# Patient Record
Sex: Female | Born: 1985 | Hispanic: No | Marital: Married | Smoking: Never smoker
Health system: Southern US, Community
[De-identification: ages and names within clinical notes are randomized; demographics above are authoritative.]

## PROBLEM LIST (undated history)

## (undated) DIAGNOSIS — E282 Polycystic ovarian syndrome: Secondary | ICD-10-CM

## (undated) HISTORY — PX: BREAST REDUCTION SURGERY: SHX8

## (undated) HISTORY — PX: DILATION AND CURETTAGE OF UTERUS: SHX78

---

## 2020-01-13 ENCOUNTER — Emergency Department (HOSPITAL_COMMUNITY): Payer: Managed Care, Other (non HMO)

## 2020-01-13 ENCOUNTER — Other Ambulatory Visit: Payer: Self-pay

## 2020-01-13 ENCOUNTER — Encounter (HOSPITAL_COMMUNITY): Payer: Self-pay

## 2020-01-13 ENCOUNTER — Emergency Department (HOSPITAL_COMMUNITY)
Admission: EM | Admit: 2020-01-13 | Discharge: 2020-01-14 | Disposition: A | Discharge status: Home / Self Care | Payer: Managed Care, Other (non HMO) | Attending: Emergency Medicine | Admitting: Emergency Medicine

## 2020-01-13 DIAGNOSIS — R0789 Other chest pain: Secondary | ICD-10-CM | POA: Diagnosis not present

## 2020-01-13 DIAGNOSIS — Y9389 Activity, other specified: Secondary | ICD-10-CM | POA: Insufficient documentation

## 2020-01-13 DIAGNOSIS — Y92410 Unspecified street and highway as the place of occurrence of the external cause: Secondary | ICD-10-CM | POA: Diagnosis not present

## 2020-01-13 DIAGNOSIS — R112 Nausea with vomiting, unspecified: Secondary | ICD-10-CM | POA: Insufficient documentation

## 2020-01-13 DIAGNOSIS — S3991XA Unspecified injury of abdomen, initial encounter: Secondary | ICD-10-CM | POA: Insufficient documentation

## 2020-01-13 DIAGNOSIS — R1011 Right upper quadrant pain: Secondary | ICD-10-CM | POA: Insufficient documentation

## 2020-01-13 DIAGNOSIS — R519 Headache, unspecified: Secondary | ICD-10-CM | POA: Diagnosis not present

## 2020-01-13 DIAGNOSIS — Y999 Unspecified external cause status: Secondary | ICD-10-CM | POA: Diagnosis not present

## 2020-01-13 DIAGNOSIS — S0990XA Unspecified injury of head, initial encounter: Secondary | ICD-10-CM | POA: Insufficient documentation

## 2020-01-13 DIAGNOSIS — S62347A Nondisplaced fracture of base of fifth metacarpal bone. left hand, initial encounter for closed fracture: Secondary | ICD-10-CM | POA: Diagnosis not present

## 2020-01-13 DIAGNOSIS — S6992XA Unspecified injury of left wrist, hand and finger(s), initial encounter: Secondary | ICD-10-CM | POA: Diagnosis present

## 2020-01-13 DIAGNOSIS — S299XXA Unspecified injury of thorax, initial encounter: Secondary | ICD-10-CM | POA: Insufficient documentation

## 2020-01-13 HISTORY — DX: Polycystic ovarian syndrome: E28.2

## 2020-01-13 LAB — I-STAT CHEM 8, ED
BUN: 6 mg/dL (ref 6–20)
Calcium, Ion: 1.13 mmol/L — ABNORMAL LOW (ref 1.15–1.40)
Chloride: 104 mmol/L (ref 98–111)
Creatinine, Ser: 0.6 mg/dL (ref 0.44–1.00)
Glucose, Bld: 96 mg/dL (ref 70–99)
HCT: 37 % (ref 36.0–46.0)
Hemoglobin: 12.6 g/dL (ref 12.0–15.0)
Potassium: 4.1 mmol/L (ref 3.5–5.1)
Sodium: 138 mmol/L (ref 135–145)
TCO2: 22 mmol/L (ref 22–32)

## 2020-01-13 LAB — I-STAT BETA HCG BLOOD, ED (MC, WL, AP ONLY): I-stat hCG, quantitative: <5 m[IU]/mL (ref <5)

## 2020-01-13 MED ORDER — HYDROCODONE-ACETAMINOPHEN 5-325 MG PO TABS: 1.0000 | ORAL_TABLET | Freq: Four times a day (QID) | ORAL | 0 refills | Status: AC | PRN

## 2020-01-13 MED ORDER — IBUPROFEN 600 MG PO TABS
600.0000 mg | ORAL_TABLET | Freq: Four times a day (QID) | ORAL | 0 refills | Status: AC | PRN
Start: 2020-01-13 — End: ?

## 2020-01-13 MED ORDER — IOHEXOL 300 MG/ML  SOLN
100.0000 mL | Freq: Once | INTRAMUSCULAR | Status: AC | PRN
  Administered 2020-01-13: 100 mL via INTRAVENOUS

## 2020-01-13 MED ORDER — MORPHINE SULFATE (PF) 4 MG/ML IV SOLN
4.0000 mg | Freq: Once | INTRAVENOUS | Status: AC
  Administered 2020-01-13: 4 mg via INTRAVENOUS
  Filled 2020-01-13: qty 1

## 2020-01-13 MED ORDER — CYCLOBENZAPRINE HCL 10 MG PO TABS: 10.0000 mg | ORAL_TABLET | Freq: Two times a day (BID) | ORAL | 0 refills | Status: AC | PRN
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
## 2020-01-13 NOTE — Discharge Instructions (Addendum)
Alternate 600 mg of ibuprofen and [phone] mg of Tylenol every 3-4 hours as needed for pain.  Take ibuprofen with food to avoid upset stomach issues.  Stop taking ibuprofen if you develop any upset stomach issues including vomiting blood, abdominal pain, or blood in stools.  Do not exceed 4000 mg of Tylenol daily.   You can take hydrocodone as needed for severe pain but do not drive, drink alcohol, or operate heavy machinery while taking this medicine as it can cause drowsiness.  Be aware this medicine also contains Tylenol and do not exceed more than 4000 mg of Tylenol daily.    You may take Flexeril up to twice daily as needed for muscle spasms. This medication may make you drowsy, so I typically only recommended only taking at night. You may also cut these tablets in half if they are too strong.   Apply ice or heat, whichever feels best, to areas of soreness 20 minutes at a time 3-4 times daily.  Do some gentle stretching throughout the day, especially during or after hot showers or baths. Take short frequent walks and avoid prolonged periods of sitting or laying.  Wear the splint at all times and keep it clean and dry.  You can cover it with a bag and tape it at the top to avoid getting it wet when showering.  Follow-up with Dr. Arita Miss the hand doctor for reevaluation of your hand fracture.  Expect to be sore for the next few day and follow up with primary care physician for recheck of ongoing symptoms (especially if symptoms persist longer than 1 week) but return to ER for emergent changing or worsening of symptoms such as loss of pulses or severe swelling, severe headache that gets worse, altered mental status/behaving unusually, persistent vomiting, excessive drowsiness, numbness to the arms or legs, unsteady gait, or slurred speech.
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
## 2020-01-14 MED ORDER — ONDANSETRON 4 MG PO TBDP
4.0000 mg | ORAL_TABLET | Freq: Once | ORAL | Status: AC
  Administered 2020-01-14: 4 mg via ORAL
  Filled 2020-01-14: qty 1

## 2020-01-14 NOTE — ED Notes (Signed)
Patient became nauseated and started vomiting right before being discharged

## 2020-01-19 ENCOUNTER — Other Ambulatory Visit: Payer: Self-pay

## 2020-01-19 ENCOUNTER — Ambulatory Visit (HOSPITAL_BASED_OUTPATIENT_CLINIC_OR_DEPARTMENT_OTHER)
Admission: RE | Admit: 2020-01-19 | Discharge: 2020-01-19 | Disposition: A | Discharge status: Home / Self Care | Payer: Managed Care, Other (non HMO) | Source: Ambulatory Visit | Attending: Plastic Surgery | Admitting: Plastic Surgery

## 2020-01-19 ENCOUNTER — Encounter: Payer: Self-pay | Admitting: Plastic Surgery

## 2020-01-19 ENCOUNTER — Ambulatory Visit (INDEPENDENT_AMBULATORY_CARE_PROVIDER_SITE_OTHER): Payer: Managed Care, Other (non HMO) | Admitting: Plastic Surgery

## 2020-01-19 VITALS — BP 132/81 | HR 83 | Temp 98.2°F | Ht 63.0 in | Wt 263.2 lb

## 2020-01-19 DIAGNOSIS — S62305A Unspecified fracture of fourth metacarpal bone, left hand, initial encounter for closed fracture: Secondary | ICD-10-CM | POA: Diagnosis present
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
## 2020-01-29 ENCOUNTER — Encounter: Payer: Self-pay | Admitting: Plastic Surgery

## 2020-01-30 ENCOUNTER — Telehealth: Payer: Self-pay | Admitting: *Deleted

## 2020-01-30 NOTE — Telephone Encounter (Signed)
Received Plan of Care on (01/29/20) via of fax from [facility].  Requesting signature.  Given to provider to complete.   Plan of Care signed and faxed.  Confirmation received.  Copy scanned into the chart.//AB/CMA

## 2020-02-11 ENCOUNTER — Ambulatory Visit: Payer: Managed Care, Other (non HMO) | Admitting: Plastic Surgery

## 2020-02-11 ENCOUNTER — Encounter: Payer: Self-pay | Admitting: Plastic Surgery

## 2020-02-11 ENCOUNTER — Ambulatory Visit (INDEPENDENT_AMBULATORY_CARE_PROVIDER_SITE_OTHER): Payer: Managed Care, Other (non HMO) | Admitting: Plastic Surgery

## 2020-02-11 ENCOUNTER — Other Ambulatory Visit: Payer: Self-pay

## 2020-02-11 VITALS — BP 125/89 | HR 105 | Temp 98.4°F

## 2020-02-11 DIAGNOSIS — S62305A Unspecified fracture of fourth metacarpal bone, left hand, initial encounter for closed fracture: Secondary | ICD-10-CM

## 2020-02-11 NOTE — Progress Notes (Signed)
   Referring Provider Medicine, [facility] [address] [place],  [place] [address]   CC:  Chief Complaint  Patient presents with  . Follow-up      Katie Velasquez is an 34 y.o. female.  HPI: Patient presents 4 weeks out from an injury to her left hand.  She had a nondisplaced fracture at the base of the fifth metacarpal.  We treated her nonoperatively with the splint and she is here to discuss moving forward with therapy.  Review of Systems General: Denies fevers or chills  Physical Exam Vitals with BMI 02/11/2020 01/19/2020 01/14/2020  Height - 5\' 3"  -  Weight - 263 lbs 3 oz -  BMI - 46.64 -  Systolic 125 132  Diastolic 89 81 79  Pulse 105 83 88    General:  No acute distress,  Alert and oriented, Non-Toxic, Normal speech and affect Left hand: Fingers well-perfused with normal cap refill palp radial pulse.  Sensation is intact throughout.  She has limited range of motion motion of the small and ring fingers due to stiffness from the splint.  She has minimal tenderness over the area of her fracture.  There is a small amount of swelling there but this may be due to the way the soft tissue was conforming to the splint.  All follow-up x-rays have shown no displacement of the fracture.  Assessment/Plan Patient presents 1 month out from a nondisplaced fracture of the metacarpal.  I suspect this is healed well at this point.  She is ready to increase her range of motion.  We will communicate with the hand therapist to move her along in their protocol.  All her questions were answered.  I will plan to see her again in 6 to 8 weeks but she can always cancel that appointment if she feels like she is doing well.  500 02/11/2020, 3:59 PM

## 2020-02-13 ENCOUNTER — Telehealth: Payer: Self-pay | Admitting: *Deleted

## 2020-02-13 NOTE — Telephone Encounter (Signed)
Received Plan of Care via of fax from [facility] on (02/12/20).  Requesting signature and return.  Plan of Care signed and faxed.  Confirmation received.  Copy scanned into the chart.//AB/CMA

## 2020-02-24 MED ORDER — PROPOFOL 10 MG/ML IV BOLUS
INTRAVENOUS | Status: AC
  Filled 2020-02-24: qty 20

## 2020-02-24 MED ORDER — FENTANYL CITRATE (PF) 100 MCG/2ML IJ SOLN
INTRAMUSCULAR | Status: AC
  Filled 2020-02-24: qty 2

## 2020-02-24 MED ORDER — KETOROLAC TROMETHAMINE 30 MG/ML IJ SOLN
INTRAMUSCULAR | Status: AC
  Filled 2020-02-24: qty 1

## 2021-12-03 IMAGING — DX DG HAND COMPLETE 3+V*L*
3 series · 4 of 4 positions shown · non-contrast
Comparison: 01/13/2020.

CLINICAL DATA: Follow-up fracture.

EXAM:
LEFT HAND - COMPLETE 3+ VIEW

[hand pa]
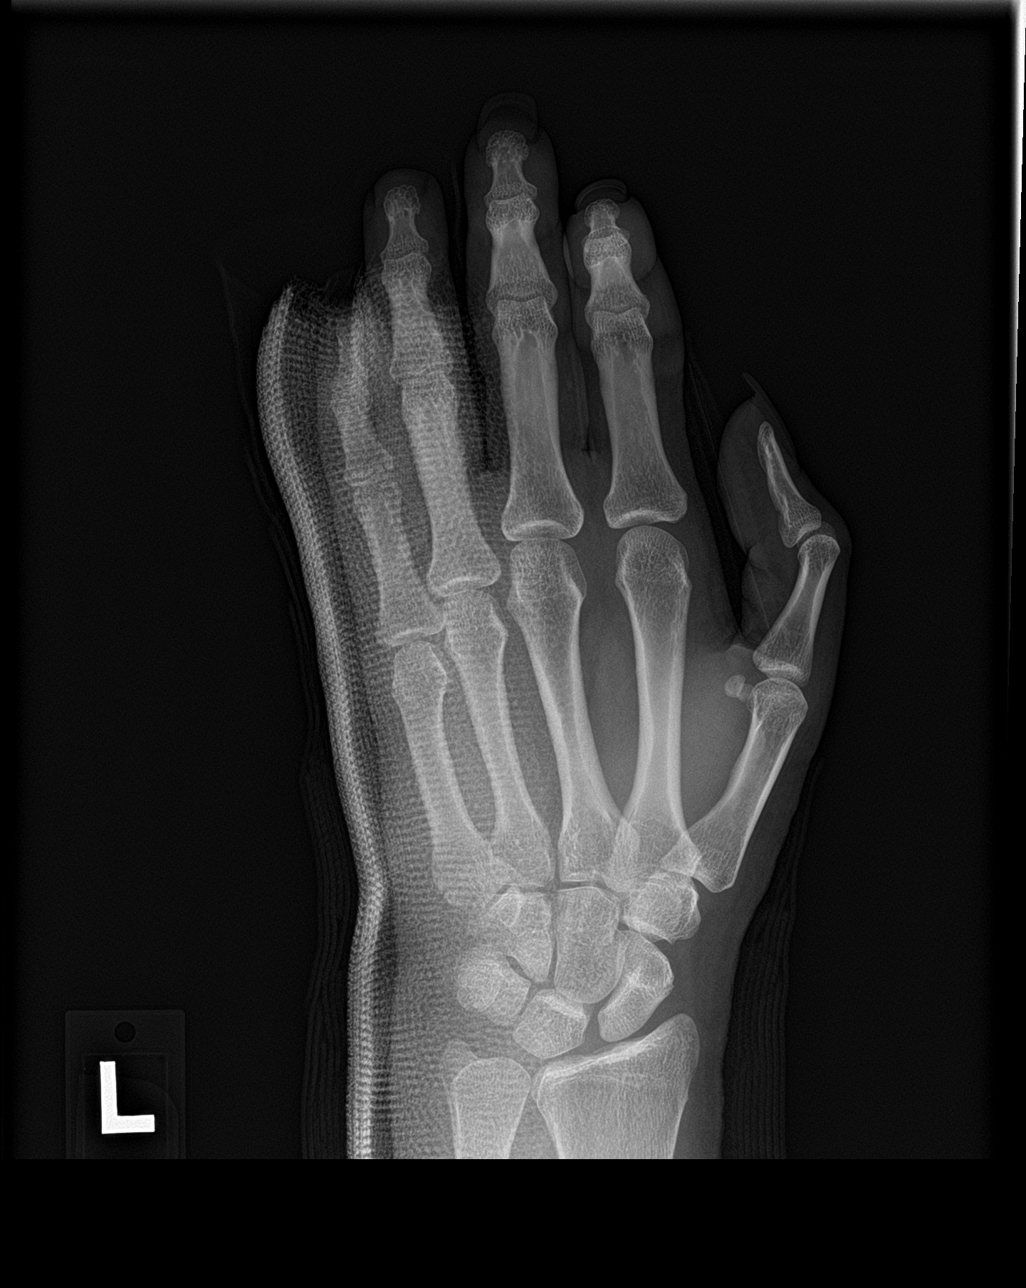

[hand obl]
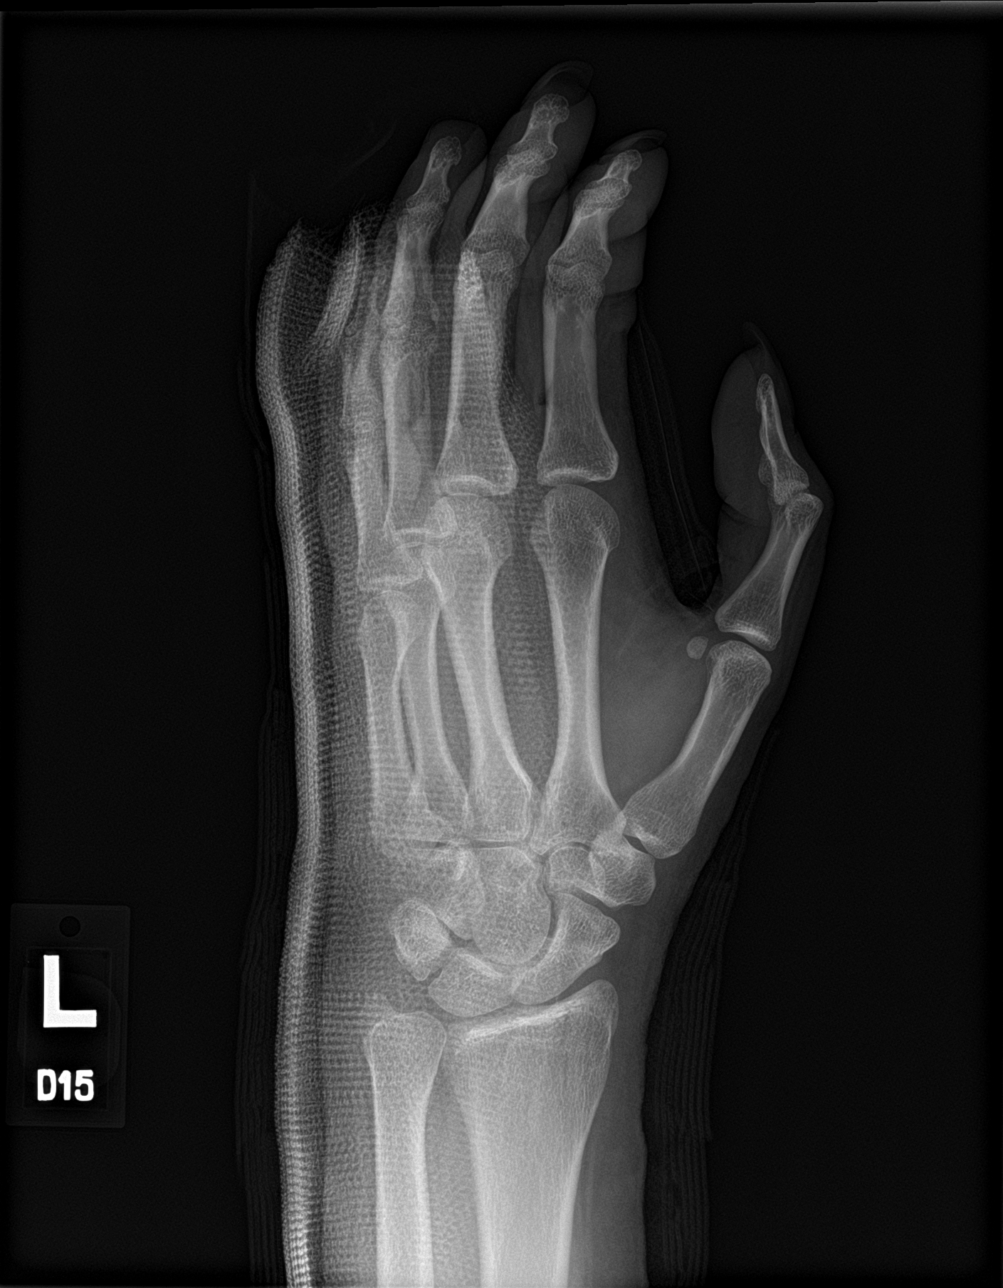

[Series 3: hand lat · 0.14mm/px · 2 of 2 slices shown]
[im 1/2]
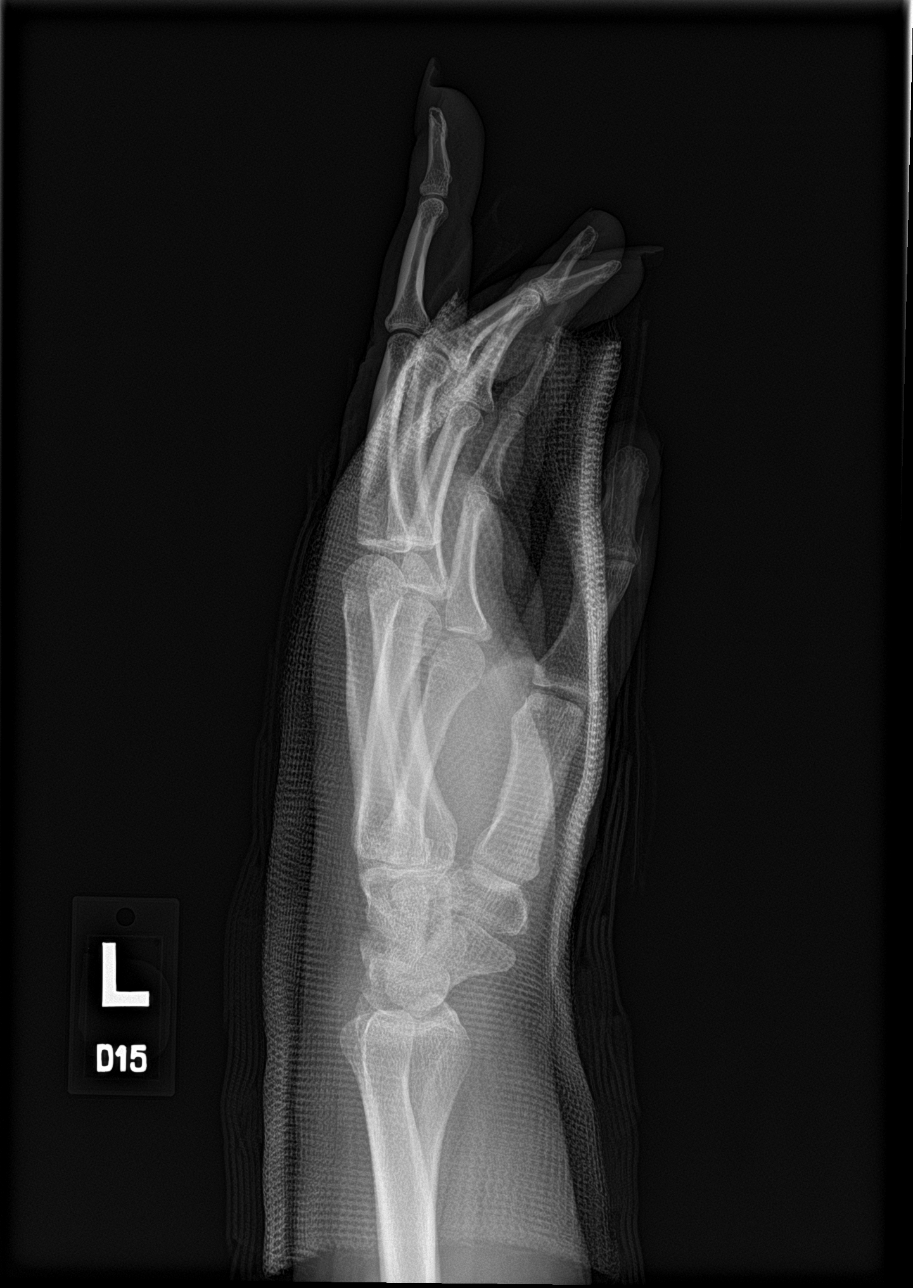
[im 2/2]
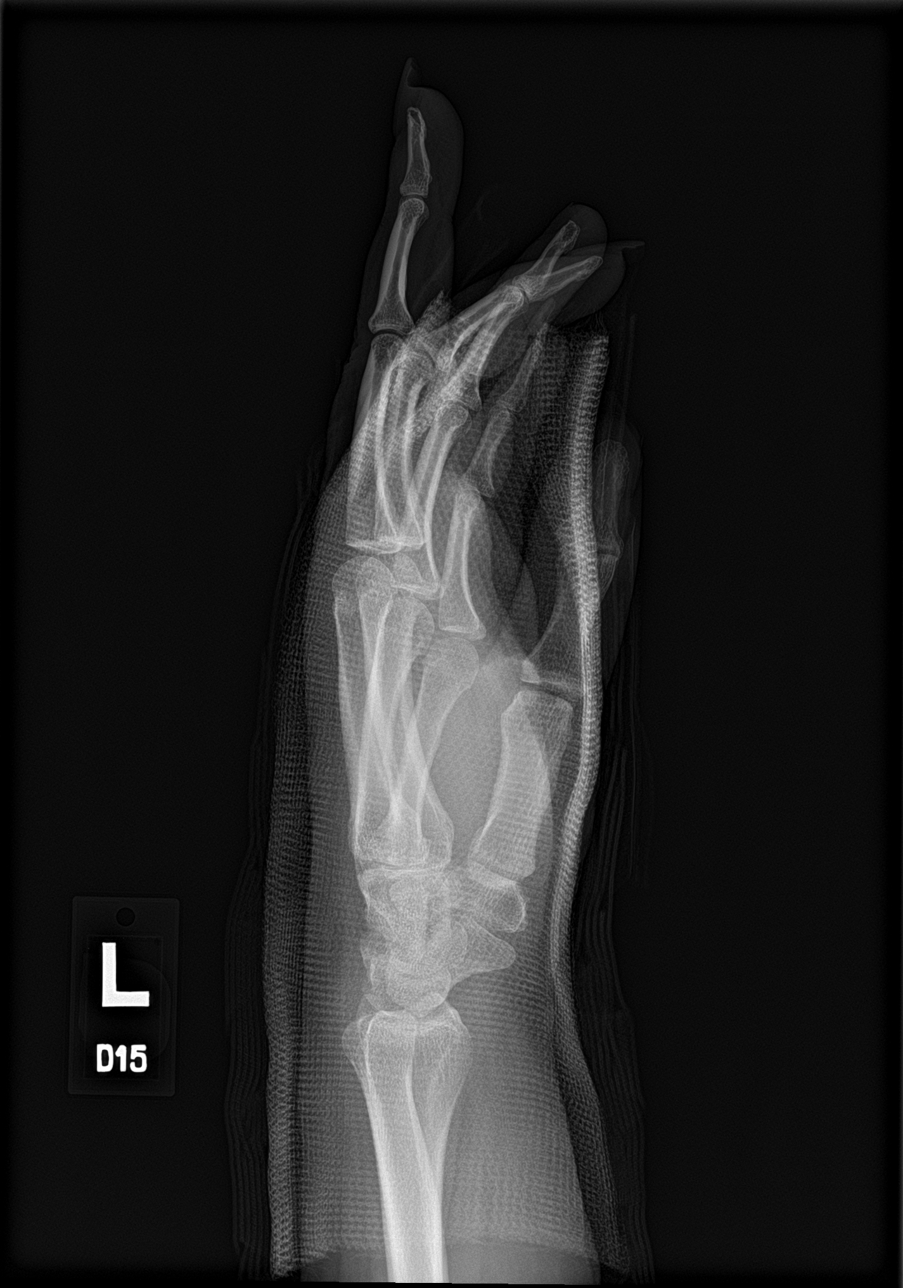

[4 of 4 positions shown; findings below may reference images not displayed]

FINDINGS: Fine bone detail is diminished due to overlying splinting material.
Within this limitation the base of fifth metacarpal bone fracture
appears stable. Alignment appears anatomic. No new fractures.
IMPRESSION: Stable appearance of fifth metacarpal bone fracture.
